# Patient Record
Sex: Male | Born: 2005 | Race: Black or African American | Hispanic: No | Marital: Single | Smoking: Never smoker
Health system: Southern US, Community
[De-identification: ages and names within clinical notes are randomized; demographics above are authoritative.]

---
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
## 2024-04-04 ENCOUNTER — Other Ambulatory Visit: Payer: Self-pay

## 2024-04-04 ENCOUNTER — Encounter (HOSPITAL_COMMUNITY): Payer: Self-pay

## 2024-04-04 ENCOUNTER — Ambulatory Visit (HOSPITAL_COMMUNITY)
Admission: EM | Admit: 2024-04-04 | Discharge: 2024-04-04 | Disposition: A | Discharge status: Home / Self Care | Attending: Physician Assistant | Admitting: Physician Assistant

## 2024-04-04 DIAGNOSIS — J101 Influenza due to other identified influenza virus with other respiratory manifestations: Secondary | ICD-10-CM | POA: Diagnosis not present

## 2024-04-04 DIAGNOSIS — R051 Acute cough: Secondary | ICD-10-CM

## 2024-04-04 LAB — POC COVID19/FLU A&B COMBO
Covid Antigen, POC: NEGATIVE
Influenza A Antigen, POC: POSITIVE — AB
Influenza B Antigen, POC: NEGATIVE

## 2024-04-04 MED ORDER — PROMETHAZINE-DM 6.25-15 MG/5ML PO SYRP: 5.0000 mL | ORAL_SOLUTION | Freq: Three times a day (TID) | ORAL | 0 refills | Status: AC | PRN

## 2024-04-04 MED ORDER — OSELTAMIVIR PHOSPHATE 75 MG PO CAPS: 75.0000 mg | ORAL_CAPSULE | Freq: Two times a day (BID) | ORAL | 0 refills | Status: AC

## 2024-04-04 NOTE — Discharge Instructions (Signed)
 You tested positive for influenza.  Start Tamiflu twice daily for 5 days.  Use Promethazine DM for cough.  This will make you sleepy so do not drive drink alcohol with taking it.  Use over-the-counter medication including Mucinex, Flonase, Tylenol, ibuprofen for pain relief.  Make sure that you rest and drink plenty of fluid.  If your symptoms are not improving within a week please return for reevaluation.  If anything worsens and you have high fever not responding to medication, chest pain, shortness of breath, worsening cough, nausea/vomiting interfering with oral intake you need to be seen immediately.  You can return to work/activities once you have been fever free without medication for 24 hours.

## 2024-04-04 NOTE — ED Provider Notes (Signed)
 [facility]    CSN: [id number] Arrival date & time: 04/04/24  1502      History   Chief Complaint Chief Complaint  Patient presents with   Cough   Sore Throat   Headache   Chills    HPI Lawrence Lee is a 18 y.o. male.   Patient presents today with a 36-hour history of URI symptoms.  He reports sore throat, cough, headache, chills, subjective fever, fatigue, malaise.  He does report a friend who was sick with similar symptoms does not know what they were diagnosed with.  He has not had influenza vaccine this season.  He has had COVID vaccines in the past but not the most recent booster.  He has not had COVID that he is aware of.  He has not been taking any over-the-counter medication for symptom management.  Denies any significant past medical history including allergies, asthma, COPD.  She does smoke.  Denies any recent antibiotics or steroids.  He is eating and drinking normally despite symptoms.    History reviewed. No pertinent past medical history.  There are no active problems to display for this patient.   History reviewed. No pertinent surgical history.     Home Medications    Prior to Admission medications   Medication Sig Start Date End Date Taking? Authorizing Provider  oseltamivir (TAMIFLU) 75 MG capsule Take 1 capsule (75 mg total) by mouth every 12 (twelve) hours. 04/04/24  Yes Chlora Mcbain K, PA-C  promethazine-dextromethorphan (PROMETHAZINE-DM) 6.25-15 MG/5ML syrup Take 5 mLs by mouth 3 (three) times daily as needed for cough. 04/04/24  Yes Channie Bostick, Rocky POUR, PA-C    Family History Family History  Problem Relation Age of Onset   ADD / ADHD Sister    Breast cancer Paternal Grandmother     Social History Social History   Tobacco Use   Smoking status: Never   Smokeless tobacco: Never  Vaping Use   Vaping status: Never Used  Substance Use Topics   Alcohol use: No   Drug use: No     Allergies   Patient has no known  allergies.   Review of Systems Review of Systems  Constitutional:  Positive for activity change, chills, fatigue and fever. Negative for appetite change.  HENT:  Positive for congestion and sore throat. Negative for sinus pressure and sneezing.   Respiratory:  Positive for cough. Negative for shortness of breath.   Cardiovascular:  Negative for chest pain.  Gastrointestinal:  Negative for diarrhea, nausea and vomiting.  Musculoskeletal:  Positive for arthralgias and myalgias.  Neurological:  Positive for headaches. Negative for dizziness and light-headedness.     Physical Exam Triage Vital Signs ED Triage Vitals  Encounter Vitals Group     BP 04/04/24 1607 126/81     Girls Systolic BP Percentile --      Girls Diastolic BP Percentile --      Boys Systolic BP Percentile --      Boys Diastolic BP Percentile --      Pulse Rate 04/04/24 1607 88     Resp 04/04/24 1607 16     Temp 04/04/24 1607 99.6 F (37.6 C)     Temp Source 04/04/24 1607 Oral     SpO2 04/04/24 1607 94 %     Weight --      Height --      Head Circumference --      Peak Flow --      Pain Score 04/04/24 1604 4  Pain Loc --      Pain Education --      Exclude from Growth Chart --    No data found.  Updated Vital Signs BP 126/81   Pulse 88   Temp 99.6 F (37.6 C) (Oral)   Resp 16   SpO2 94%   Visual Acuity Right Eye Distance:   Left Eye Distance:   Bilateral Distance:    Right Eye Near:   Left Eye Near:    Bilateral Near:     Physical Exam Vitals reviewed.  Constitutional:      General: He is awake.     Appearance: Normal appearance. He is well-developed. He is not ill-appearing.     Comments: Very pleasant male appears stated age in no acute distress sitting comfortably in exam room  HENT:     Head: Normocephalic and atraumatic.     Right Ear: Tympanic membrane, ear canal and external ear normal. Tympanic membrane is not erythematous or bulging.     Left Ear: Tympanic membrane, ear canal  and external ear normal. Tympanic membrane is not erythematous or bulging.     Nose: Nose normal.     Mouth/Throat:     Pharynx: Uvula midline. Posterior oropharyngeal erythema present. No oropharyngeal exudate or uvula swelling.  Cardiovascular:     Rate and Rhythm: Normal rate and regular rhythm.     Heart sounds: Normal heart sounds, S1 normal and S2 normal. No murmur heard. Pulmonary:     Effort: Pulmonary effort is normal. No accessory muscle usage or respiratory distress.     Breath sounds: Normal breath sounds. No stridor. No wheezing, rhonchi or rales.     Comments: Clear to auscultation bilaterally Neurological:     Mental Status: He is alert.  Psychiatric:        Behavior: Behavior is cooperative.      UC Treatments / Results  Labs (all labs ordered are listed, but only abnormal results are displayed) Labs Reviewed  POC COVID19/FLU A&B COMBO - Abnormal; Notable for the following components:      Result Value   Influenza A Antigen, POC Positive (*)    All other components within normal limits    EKG   Radiology No results found.  Procedures Procedures (including critical care time)  Medications Ordered in UC Medications - No data to display  Initial Impression / Assessment and Plan / UC Course  I have reviewed the triage vital signs and the nursing notes.  Pertinent labs & imaging results that were available during my care of the patient were reviewed by me and considered in my medical decision making (see chart for details).     Patient is well-appearing, afebrile, nontoxic, nontachycardic.  He tested positive for influenza A.  He is within 48 hours of symptom onset so we will start Tamiflu 75 mg twice daily for 5 days.  We did discuss potential side effects including GI upset and abnormal behavior/dreams.  If he has any concerning symptoms he is to stop the medication to be seen immediately.  He was given Promethazine DM for cough and we discussed that this  can be sedating.  Recommended he use over-the-counter medications including alternating Tylenol  and ibuprofen  to manage fever and discomfort.  He is to push fluids.  Discussed that if symptoms are not improving within a week or if he has any worsening symptoms he needs to be seen immediately.  Strict return precautions given.  Excuse note provided.   Final Clinical  Impressions(s) / UC Diagnoses   Final diagnoses:  Acute cough  Influenza A     Discharge Instructions      You tested positive for influenza.  Start Tamiflu twice daily for 5 days.  Use Promethazine DM for cough.  This will make you sleepy so do not drive drink alcohol with taking it.  Use over-the-counter medication including Mucinex, Flonase, Tylenol , ibuprofen  for pain relief.  Make sure that you rest and drink plenty of fluid.  If your symptoms are not improving within a week please return for reevaluation.  If anything worsens and you have high fever not responding to medication, chest pain, shortness of breath, worsening cough, nausea/vomiting interfering with oral intake you need to be seen immediately.  You can return to work/activities once you have been fever free without medication for 24 hours.      ED Prescriptions     Medication Sig Dispense Auth. Provider   oseltamivir (TAMIFLU) 75 MG capsule Take 1 capsule (75 mg total) by mouth every 12 (twelve) hours. 10 capsule Shylie Polo K, PA-C   promethazine-dextromethorphan (PROMETHAZINE-DM) 6.25-15 MG/5ML syrup Take 5 mLs by mouth 3 (three) times daily as needed for cough. 118 mL Frankey Botting K, PA-C      PDMP not reviewed this encounter.   Sherrell Rocky POUR, PA-C 04/04/24 1658

## 2024-04-04 NOTE — ED Triage Notes (Signed)
 Pt c/o sore throat, dry cough, HA, eye pain bilat, epigastric pain, loss of appetite, chillsx2d
